# Patient Record
Sex: Male | Born: 1990 | Race: White | Hispanic: No | Marital: Single | Smoking: Former smoker
Health system: Southern US, Community
[De-identification: ages and names within clinical notes are randomized; demographics above are authoritative.]

---
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
## 2010-10-19 NOTE — Op Note (Signed)
[place]  Patient:    Wesley Cooper, Wesley Cooper Visit Number: [id number] MRN: [id number]          Service Type: DSU Location: [facility] Attending Physician:  Susy Frizzle Dictated by:   Jeannett Senior Pollyann Kennedy, M.D. Proc. Date: 10/19/01 Admit Date:  10/19/2001 Discharge Date: 10/20/2001                             Operative Report  PREOPERATIVE DIAGNOSES:  Obstructive tonsil and adenoid hypertrophy.  POSTOPERATIVE DIAGNOSES:  Obstructive tonsil and adenoid hypertrophy.  OPERATION PERFORMED:  Tonsillectomy and adenoidectomy.  SURGEON:  Jefry H. Pollyann Kennedy, M.D.  ANESTHESIA:  General endotracheal.  COMPLICATIONS:  None.  ESTIMATED BLOOD LOSS:  5 cc.  FINDINGS:  Severe enlargement of the tonsils with obstruction of the oropharynx, mild to moderate enlargement of the adenoid with partial obstruction of the nasopharynx.  There was purulent secretion surrounding the folds of the adenoid tissue.  There was also a significant amount of tonsil cryptic debris present bilaterally.  DISPOSITION:  The patient tolerated the procedure well, was awakened extubated and transferred to recovery in stable condition.  REFERRING PHYSICIAN:  Dr. Gabriel Rung.  INDICATIONS FOR PROCEDURE:  The patient is an 20 year old child with a long history of severe snoring and obstructive sleep apnea based on polysomnography.  The risks, benefits, alternatives and complications of the procedure were explained to the mother, who seemed to understand and agreed to surgery.  DESCRIPTION OF PROCEDURE:  The patient was taken to the operating room and placed on the operating table in the supine position.  Following induction of general endotracheal anesthesia, the table was turned to 90 degrees.  The patient was draped in the standard fashion.  The Crowe-Davis mouth gag was inserted into the oral cavity and used to retract the tongue and mandible and attached to the Mayo stand.  Inspection of the palate  revealed no evidence of the submucous cleft or shortening of the soft palate.  A red rubber catheter was inserted into the right side of the nose and withdrawn through the mouth and used to retract the soft palate and uvula.  Indirect exam of the nasopharynx was performed nasopharynx was performed and a large adenoid curet was used in two passes to remove the majority of the adenoid tissue.  The nasopharynx was then packed during the tonsillectomy.  Tonsillectomy was performed using electrocautery dissection, carefully dissecting the avascular plane between the tonsil capsule and the constrictor muscles bilaterally. Several small bleeders encountered during the dissection were coagulated.  The tonsils were very large and with cryptic debris.  They were sent together with the adenoid tissue for pathologic evaluation.  The packing was removed from the nasopharynx and suction cautery was used to obliterate additional lymphoid tissue around the nasopharynx in addition to provide complete hemostasis of the nasopharynx.  The pharynx was suctioned of blood and secretions, irrigated with saline solution and an orogastric tube was used to aspirate the contents of the stomach.  The mouth gag was released, inspected for further bleeding and the patient was then awakened, extubated and transferred to recovery in stable condition. Dictated by:   Jeannett Senior Pollyann Kennedy, M.D. Attending Physician:  Susy Frizzle DD:  10/19/01 TD:  10/20/01 Job: [id number]

## 2011-03-11 LAB — INFLUENZA A AND B ANTIGEN (CONVERTED LAB): Inflenza A Ag: NEGATIVE

## 2011-03-20 LAB — COMPREHENSIVE METABOLIC PANEL
ALT: 21
Calcium: 9.2
Glucose, Bld: 100 — ABNORMAL HIGH
Sodium: 133 — ABNORMAL LOW
Total Protein: 8.2

## 2011-03-20 LAB — DIFFERENTIAL
Eosinophils Absolute: 0
Lymphs Abs: 0.4 — ABNORMAL LOW
Monocytes Relative: 11 — ABNORMAL HIGH
Neutrophils Relative %: 80 — ABNORMAL HIGH

## 2011-03-20 LAB — URINE MICROSCOPIC-ADD ON

## 2011-03-20 LAB — URINALYSIS, ROUTINE W REFLEX MICROSCOPIC
Glucose, UA: NEGATIVE
Hgb urine dipstick: NEGATIVE
Protein, ur: 30 — AB
Specific Gravity, Urine: 1.031 — ABNORMAL HIGH

## 2011-03-20 LAB — CBC
Hemoglobin: 15.6
MCHC: 34
Platelets: 188
RDW: 12.6

## 2011-03-20 LAB — RAPID STREP SCREEN (MED CTR MEBANE ONLY): Streptococcus, Group A Screen (Direct): NEGATIVE

## 2016-07-30 ENCOUNTER — Encounter (HOSPITAL_COMMUNITY): Payer: Self-pay | Admitting: Emergency Medicine

## 2016-07-30 ENCOUNTER — Emergency Department (HOSPITAL_COMMUNITY)
Admission: EM | Admit: 2016-07-30 | Discharge: 2016-07-30 | Disposition: A | Discharge status: Home / Self Care | Payer: Self-pay | Attending: Emergency Medicine | Admitting: Emergency Medicine

## 2016-07-30 DIAGNOSIS — Z87891 Personal history of nicotine dependence: Secondary | ICD-10-CM | POA: Insufficient documentation

## 2016-07-30 DIAGNOSIS — K1370 Unspecified lesions of oral mucosa: Secondary | ICD-10-CM | POA: Insufficient documentation

## 2016-07-30 MED ORDER — PENICILLIN V POTASSIUM 500 MG PO TABS: 500.0000 mg | ORAL_TABLET | Freq: Four times a day (QID) | ORAL | 0 refills | Status: AC

## 2016-07-30 NOTE — Discharge Instructions (Signed)
Take penicillin 4 times daily for 7 days. Please take full course of antibiotics as prescribed. Maintain good oral hygiene with brushing teeth twice a day and using antiseptic mouthwash every day. Please follow-up with a dentist using the resource guide attached.  Contact a health care provider if: You have a fever. Your pain is not controlled with medicine. Your symptoms do not improve with treatment. Get help right away if: You develop pain or swelling in your face or jaw. You cannot eat or drink. You have trouble breathing or swallowing. You have a rapid heart rate.

## 2016-07-30 NOTE — ED Provider Notes (Signed)
[facility] Provider Note   CSN: [id number] Arrival date & time: 07/30/16  4098   By signing my name below, I, Freida Busman, attest that this documentation has been prepared under the direction and in the presence of Baleria Wyman, [place]. Electronically Signed: Freida Busman, Scribe. 07/30/2016. 11:03 AM.  History   Chief Complaint Chief Complaint  Patient presents with  . Mouth Lesions   The history is provided by the patient. No language interpreter was used.     HPI Comments:  Wesley Cooper is a 26 y.o. male who presents to the Emergency Department complaining of a white film over  his gums which he first noticed yesterday that worsened this AM.  He reports associated painful "canker sores" in his mouth and swollen gums. He has taken liquid Tylenol at home with mild relief.  No sick contacts at home. No fever or chills in the last 2 days. No nausea or vomiting. No recent use of albuterol inhaler. He denies h/o STDs and HIV. Pt does not have a dentist at this time.   History reviewed. No pertinent past medical history.  There are no active problems to display for this patient.   History reviewed. No pertinent surgical history.     Home Medications    Prior to Admission medications   Medication Sig Start Date End Date Taking? Authorizing Provider  penicillin v potassium (VEETID) 500 MG tablet Take 1 tablet (500 mg total) by mouth 4 (four) times daily. 07/30/16 08/06/16  Alvina Chou, [place]    Family History History reviewed. No pertinent family history.  Social History Social History  Substance Use Topics  . Smoking status: Former Games developer  . Smokeless tobacco: Never Used  . Alcohol use No     Allergies   Patient has no known allergies.   Review of Systems Review of Systems  Constitutional: Negative for chills and fever.  HENT: Positive for ear pain and mouth sores.   Respiratory: Negative for shortness of breath.   Cardiovascular: Negative  for chest pain.  Gastrointestinal: Negative for diarrhea and vomiting.   Physical Exam Updated Vital Signs BP 114/79 (BP Location: Left Arm)   Pulse 80   Temp 98.9 F (37.2 C) (Oral)   Resp 16   SpO2 100%   Physical Exam  Constitutional: He is oriented to person, place, and time. He appears well-developed and well-nourished. No distress.  Well-appearing  HENT:  Head: Normocephalic and atraumatic.  Outer portion of gums with whitish layer  Non-painful; no swelling of the gums No obvious redness  No evidence of cold sores No whitish layer on oropharynx or mucosa.  Eyes: Conjunctivae are normal.  Cardiovascular: Normal rate.   Pulmonary/Chest: Effort normal. No respiratory distress.  Abdominal: He exhibits no distension.  Neurological: He is alert and oriented to person, place, and time.  Skin: Skin is warm and dry.  Psychiatric: He has a normal mood and affect.  Nursing note and vitals reviewed.    ED Treatments / Results  DIAGNOSTIC STUDIES:  Oxygen Saturation is 100% on RA, normal by my interpretation.    COORDINATION OF CARE:  11:00 AM Discussed treatment plan with pt at bedside and pt agreed to plan.  Labs (all labs ordered are listed, but only abnormal results are displayed) Labs Reviewed - No data to display  EKG  EKG Interpretation None       Radiology No results found.  Procedures Procedures (including critical care time)  Medications Ordered in ED Medications -  No data to display   Initial Impression / Assessment and Plan / ED Course  I have reviewed the triage vital signs and the nursing notes.  Pertinent labs & imaging results that were available during my care of the patient were reviewed by me and considered in my medical decision making (see chart for details).    Patient here with possible trench mouth. Does not appear to be oral thrush. He presents today with a wise film on exterior portion of gums throughout upper and lower portions.  Patient denies any history of STDs or HIV. Patient denies any use of inhalers. There is no evidence of whitish layer on tongue or oropharynx or mucosa. He does not have a history of this. He'll be given penicillin to go home with and it referral to dentist regarding today's visit. Patient verbalizes understanding. Patient is in no apparent distress. Hemodynamically stable. Reasons to immediately return to the emergency department discussed.  Case discussed with Dr. Judd Lienelo who agree with assessment and plan.  Final Clinical Impressions(s) / ED Diagnoses   Final diagnoses:  Mouth problem    New Prescriptions New Prescriptions   PENICILLIN V POTASSIUM (VEETID) 500 MG TABLET    Take 1 tablet (500 mg total) by mouth 4 (four) times daily.   I personally performed the services described in this documentation, which was scribed in my presence. The recorded information has been reviewed and is accurate.    [address]Lyndel Dancel Manuel [place], [place] 07/30/16 1139    Geoffery Lyonsouglas Delo, MD 07/30/16 1349

## 2016-07-30 NOTE — ED Triage Notes (Signed)
Pt from home with c/o fever, headache, N/D, hot and cold chills since last Tuesday with some symptoms resolving.  Pt now reports mouth sores and a while film coating his mouth starting yesterday.  Pt in NAD, A&O.

## 2021-07-17 ENCOUNTER — Encounter (HOSPITAL_COMMUNITY): Payer: Self-pay | Admitting: Emergency Medicine

## 2021-07-17 ENCOUNTER — Ambulatory Visit (HOSPITAL_COMMUNITY)
Admission: EM | Admit: 2021-07-17 | Discharge: 2021-07-17 | Disposition: A | Discharge status: Home / Self Care | Payer: Self-pay | Attending: Emergency Medicine | Admitting: Emergency Medicine

## 2021-07-17 ENCOUNTER — Other Ambulatory Visit: Payer: Self-pay

## 2021-07-17 DIAGNOSIS — A084 Viral intestinal infection, unspecified: Secondary | ICD-10-CM

## 2021-07-17 MED ORDER — ONDANSETRON 4 MG PO TBDP: 4.0000 mg | ORAL_TABLET | Freq: Three times a day (TID) | ORAL | 0 refills | Status: AC | PRN

## 2021-07-17 NOTE — ED Provider Notes (Signed)
[facility]    CSN: [id number] Arrival date & time: 07/17/21  1222      History   Chief Complaint Chief Complaint  Patient presents with   Abdominal Pain    HPI Wesley Cooper is a 31 y.o. male.   Presents with abdominal bloating, diarrhea, nausea with vomiting beginning 1 day ago.  Last episode of diarrhea this morning, last episode of vomiting this morning.  Was able to tolerate food and liquids today.  Has been attempting a blander diet to manage symptoms.  Denies fever, chills, body aches, URI symptoms. Possible sick contacts in household.  No pertinent medical history.  History reviewed. No pertinent past medical history.  There are no problems to display for this patient.   History reviewed. No pertinent surgical history.     Home Medications    Prior to Admission medications   Not on File    Family History Family History  Problem Relation Age of Onset   Healthy Mother    Healthy Father     Social History Social History   Tobacco Use   Smoking status: Former   Smokeless tobacco: Never  Building services engineer Use: Never used  Substance Use Topics   Alcohol use: No   Drug use: No     Allergies   Patient has no known allergies.   Review of Systems Review of Systems  Constitutional: Negative.   HENT: Negative.    Respiratory: Negative.    Cardiovascular: Negative.   Gastrointestinal:  Positive for abdominal pain, diarrhea and vomiting. Negative for abdominal distention, anal bleeding, blood in stool, constipation, nausea and rectal pain.  Skin: Negative.   Neurological: Negative.     Physical Exam Triage Vital Signs ED Triage Vitals  Enc Vitals Group     BP 07/17/21 1459 116/77     Pulse Rate 07/17/21 1459 90     Resp 07/17/21 1459 (!) 22     Temp 07/17/21 1459 (!) 97.4 F (36.3 C)     Temp Source 07/17/21 1459 Oral     SpO2 07/17/21 1459 97 %     Weight --      Height --      Head Circumference --      Peak Flow --       Pain Score 07/17/21 1456 3     Pain Loc --      Pain Edu? --      Excl. in GC? --    No data found.  Updated Vital Signs BP 116/77 (BP Location: Right Arm) Comment (BP Location): large cuff   Pulse 90    Temp (!) 97.4 F (36.3 C) (Oral)    Resp (!) 22    SpO2 97%   Visual Acuity Right Eye Distance:   Left Eye Distance:   Bilateral Distance:    Right Eye Near:   Left Eye Near:    Bilateral Near:     Physical Exam Constitutional:      Appearance: He is well-developed.  HENT:     Head: Normocephalic.  Eyes:     Extraocular Movements: Extraocular movements intact.  Pulmonary:     Effort: Pulmonary effort is normal.  Abdominal:     General: Abdomen is flat. Bowel sounds are increased.     Palpations: Abdomen is soft.     Tenderness: There is no abdominal tenderness.  Skin:    General: Skin is warm and dry.  Neurological:  General: No focal deficit present.     Mental Status: He is alert and oriented to person, place, and time.  Psychiatric:        Mood and Affect: Mood normal.        Behavior: Behavior normal.     UC Treatments / Results  Labs (all labs ordered are listed, but only abnormal results are displayed) Labs Reviewed - No data to display  EKG   Radiology No results found.  Procedures Procedures (including critical care time)  Medications Ordered in UC Medications - No data to display  Initial Impression / Assessment and Plan / UC Course  I have reviewed the triage vital signs and the nursing notes.  Pertinent labs & imaging results that were available during my care of the patient were reviewed by me and considered in my medical decision making (see chart for details).  Viral gastroenteritis  Etiology of symptoms are most likely viral, low suspicion of acute abdomen, vital signs are stable, unable to reproduce tenderness on exam and per patient symptoms are already beginning to improve, prescribe Zofran to be used every 8 hours as needed if  vomiting returns, may use over-the-counter Imodium for additional supportive care, advised patient to increase fluid intake in continue to eat solid foods as tolerated, may follow-up with urgent care as needed, work note given Final Clinical Impressions(s) / UC Diagnoses   Final diagnoses:  None   Discharge Instructions   None    ED Prescriptions   None    PDMP not reviewed this encounter.   Valinda Hoar, [place] 07/17/21 [phone]

## 2021-07-17 NOTE — Discharge Instructions (Signed)
Your symptoms are most likely caused by a virus, it will work its way out your system over the next few days  You can use zofran every 6 hours as needed for nausea, be mindful this medication may make you drowsy, take the first dose at home to see how it affects your body  You can use Imodium to help with diarrhea, and be mindful over use of this medication may cause opposite effect constipation  You can use over-the-counter ibuprofen or Tylenol, which ever you have at home, to help manage fevers  Continue to promote hydration throughout the day by using electrolyte replacement solution such as Gatorade, body armor, Pedialyte, which ever you have at home  Try eating bland foods such as bread, rice, toast, fruit which are easier on the stomach to digest, avoid foods that are overly spicy, overly seasoned or greasy  

## 2021-07-17 NOTE — ED Triage Notes (Addendum)
Last week had nausea, fever.  This week patient has diarrhea, bloating.  This morning woke up vomiting, one episode of vomiting.  Patient did call into work.  Needs a work note
